# Patient Record
Sex: Female | Born: 1965 | Race: White | Hispanic: No | Marital: Married | Smoking: Never smoker
Health system: Southern US, Community
[De-identification: ages and names within clinical notes are randomized; demographics above are authoritative.]

## PROBLEM LIST (undated history)

## (undated) HISTORY — PX: OTHER SURGICAL HISTORY: SHX169

---

## 1998-01-21 ENCOUNTER — Other Ambulatory Visit: Admission: RE | Admit: 1998-01-21 | Discharge: 1998-01-21 | Payer: Self-pay | Admitting: Obstetrics and Gynecology

## 2015-09-16 ENCOUNTER — Ambulatory Visit (INDEPENDENT_AMBULATORY_CARE_PROVIDER_SITE_OTHER): Payer: BLUE CROSS/BLUE SHIELD | Admitting: Family Medicine

## 2015-09-16 VITALS — BP 126/78 | HR 86 | Temp 99.1°F | Resp 18 | Ht 64.5 in | Wt 180.0 lb

## 2015-09-16 DIAGNOSIS — R42 Dizziness and giddiness: Secondary | ICD-10-CM

## 2015-09-16 DIAGNOSIS — E669 Obesity, unspecified: Secondary | ICD-10-CM

## 2015-09-16 LAB — COMPLETE METABOLIC PANEL WITH GFR
ALT: 24 U/L (ref 6–29)
AST: 24 U/L (ref 10–35)
Albumin: 4.1 g/dL (ref 3.6–5.1)
Alkaline Phosphatase: 72 U/L (ref 33–115)
BUN: 14 mg/dL (ref 7–25)
CALCIUM: 9.4 mg/dL (ref 8.6–10.2)
CHLORIDE: 103 mmol/L (ref 98–110)
CO2: 26 mmol/L (ref 20–31)
Creat: 0.87 mg/dL (ref 0.50–1.10)
GFR, Est African American: >89 mL/min (ref >=60)
GFR, Est Non African American: 80 mL/min (ref >=60)
GLUCOSE: 105 mg/dL — AB (ref 65–99)
POTASSIUM: 3.8 mmol/L (ref 3.5–5.3)
SODIUM: 137 mmol/L (ref 135–146)
Total Bilirubin: 0.3 mg/dL (ref 0.2–1.2)
Total Protein: 7.2 g/dL (ref 6.1–8.1)

## 2015-09-16 LAB — CBC
HEMATOCRIT: 35.3 % (ref 35.0–45.0)
HEMOGLOBIN: 11.5 g/dL — AB (ref 11.7–15.5)
MCH: 27.6 pg (ref 27.0–33.0)
MCHC: 32.6 g/dL (ref 32.0–36.0)
MCV: 84.9 fL (ref 80.0–100.0)
MPV: 9.2 fL (ref 7.5–12.5)
PLATELETS: 321 10*3/uL (ref 140–400)
RBC: 4.16 MIL/uL (ref 3.80–5.10)
RDW: 15 % (ref 11.0–15.0)
WBC: 5 10*3/uL (ref 3.8–10.8)

## 2015-09-16 LAB — TSH: TSH: 0.96 m[IU]/L

## 2015-09-16 LAB — HEMOGLOBIN A1C
HEMOGLOBIN A1C: 5.4 % (ref <5.7)
Mean Plasma Glucose: 108 mg/dL

## 2015-09-16 NOTE — Progress Notes (Signed)
   Subjective:    Patient ID: Brittany Bell, female    DOB: 01-10-1966, 50 y.o.   MRN: [id number]  HPI This is a pleasant 50 yo female who presents today with dizziness off and on today. She did not have a sensation of spinning, but felt like she had to catch her balance. She felt off balance for a couple of hours. She feels better now, but her head feels a little "light." She has a history of seasonal allergies and takes Zyrtec. Had a cold with cough last month, but all symptoms resolved. Some pressure in ears. No SOB, no wheezing. Slept well last night. No nausea, no vomiting, no diarrhea or constipation. No chest pain, no palpitations. No falls. Does not feel as though she is going to lose consciousness. No headache.   She doesn't have regular health care. Has been healthy. 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 She has not had any problems with these abnormalities.   History reviewed. No pertinent past medical history. History reviewed. No pertinent past surgical history. History reviewed. No pertinent family history. Social History  Substance Use Topics  . Smoking status: Never Smoker   . Smokeless tobacco: None  . Alcohol Use: None    Review of Systems Per HPI    Objective:   Physical Exam  Constitutional: She is oriented to person, place, and time. She appears well-developed and well-nourished. No distress.  HENT:  Head: Normocephalic and atraumatic.  Right Ear: Tympanic membrane and ear canal normal.  Left Ear: Tympanic membrane, external ear and ear canal normal.  Nose: Mucosal edema and rhinorrhea present.  Mouth/Throat: Uvula is midline.  Right ear with misshapen lobe. High palate.   Eyes: Conjunctivae and EOM are normal. Pupils are equal, round, and reactive to light. Right eye exhibits no discharge. Left eye exhibits no discharge.  Neck: Normal range of motion. Neck supple.  Cardiovascular: Normal rate,  regular rhythm and normal heart sounds.   Pulmonary/Chest: Effort normal and breath sounds normal.  Abdominal: Soft. Bowel sounds are normal. She exhibits no distension. There is no tenderness. There is no rebound and no guarding.  Musculoskeletal: Normal range of motion.  Lymphadenopathy:    She has no cervical adenopathy.  Neurological: She is alert and oriented to person, place, and time. She has normal reflexes. No cranial nerve deficit. Coordination normal.  She is not currently symptomatic and I was unable to reproduce any symptoms with head movement or rapid change in position.   Skin: Skin is warm and dry. She is not diaphoretic.  Psychiatric: She has a normal mood and affect. Her behavior is normal. Judgment and thought content normal.  Vitals reviewed.  BP 126/78 mmHg  Pulse 86  Temp(Src) 99.1 F (37.3 C) (Oral)  Resp 18  Ht 5' 4.5" (1.638 m)  Wt 180 lb (81.647 kg)  BMI 30.43 kg/m2  SpO2 99%  LMP 09/16/2015  Orthostatic vs: Lying: 137/85, 72 Sitting: 126/83, 73 Standing: 125/80, 77    Assessment & Plan:  1. Lightheadedness - CBC - TSH - COMPLETE METABOLIC PANEL WITH GFR - Hemoglobin A1c - RTC if worsening symptoms or if no improvement in 2-3 days  2. Obesity (BMI 30-39.9) - TSH - COMPLETE METABOLIC PANEL WITH GFR - Hemoglobin A1c  - encouraged her to schedule mammogram and suggested CPE Clarene Reamer, FNP-BC  [facility]  09/17/2015 10:58 PM

## 2015-09-16 NOTE — Patient Instructions (Addendum)
Please drink enough liquids so your urine is light yellow Please come back in to see Brittany Bell or go to the emergency room if your symptoms worsen, you develop persistent vomiting or diarrhea.   We recommend that you schedule a mammogram for breast cancer screening. Typically, you do not need a referral to do this. Please contact a local imaging center to schedule your mammogram.  [facility] - [phone]  *ask for the Radiology Department [facility] - [phone] or [phone]  [facility] - [phone] [place] [phone] [facility] - [phone]  *ask for the [facility] - [phone]  *ask for the Radiology Department [facility] - [phone]  *ask for the [facility] - [phone]    Dizziness Dizziness is a common problem. It is a feeling of unsteadiness or light-headedness. You may feel like you are about to faint. Dizziness can lead to injury if you stumble or fall. Anyone can become dizzy, but dizziness is more common in older adults. This condition can be caused by a number of things, including medicines, dehydration, or illness. HOME CARE INSTRUCTIONS Taking these steps may help with your condition: Eating and Drinking  Drink enough fluid to keep your urine clear or pale yellow. This helps to keep you from becoming dehydrated. Try to drink more clear fluids, such as water.  Do not drink alcohol.  Limit your caffeine intake if directed by your health care provider.  Limit your salt intake if directed by your health care provider. Activity  Avoid making quick movements.  Rise slowly from chairs and steady yourself until you feel okay.  In the morning, first sit up on the side of the bed. When you feel okay, stand slowly while you hold onto something until you know that your balance is fine.  Move your  legs often if you need to stand in one place for a long time. Tighten and relax your muscles in your legs while you are standing.  Do not drive or operate heavy machinery if you feel dizzy.  Avoid bending down if you feel dizzy. Place items in your home so that they are easy for you to reach without leaning over. Lifestyle  Do not use any tobacco products, including cigarettes, chewing tobacco, or electronic cigarettes. If you need help quitting, ask your health care provider.  Try to reduce your stress level, such as with yoga or meditation. Talk with your health care provider if you need help. General Instructions  Watch your dizziness for any changes.  Take medicines only as directed by your health care provider. Talk with your health care provider if you think that your dizziness is caused by a medicine that you are taking.  Tell a friend or a family member that you are feeling dizzy. If he or she notices any changes in your behavior, have this person call your health care provider.  Keep all follow-up visits as directed by your health care provider. This is important. SEEK MEDICAL CARE IF:  Your dizziness does not go away.  Your dizziness or light-headedness gets worse.  You feel nauseous.  You have reduced hearing.  You have new symptoms.  You are unsteady on your feet or you feel like the room is spinning. SEEK IMMEDIATE MEDICAL CARE IF:  You vomit or have diarrhea and are unable to eat or drink anything.  You have problems  talking, walking, swallowing, or using your arms, hands, or legs.  You feel generally weak.  You are not thinking clearly or you have trouble forming sentences. It may take a friend or family member to notice this.  You have chest pain, abdominal pain, shortness of breath, or sweating.  Your vision changes.  You notice any bleeding.  You have a headache.  You have neck pain or a stiff neck.  You have a fever.   This information is not  intended to replace advice given to you by your health care provider. Make sure you discuss any questions you have with your health care provider.   Document Released: 11/01/2000 Document Revised: 09/22/2014 Document Reviewed: 05/04/2014 Elsevier Interactive Patient Education 2016 [employer].     IF you received an x-ray today, you will receive an invoice from [facility]. Please contact [facility] at [phone] with questions or concerns regarding your invoice.   IF you received labwork today, you will receive an invoice from [employer]. Please contact [facility] at [phone] with questions or concerns regarding your invoice.   Our billing staff will not be able to assist you with questions regarding bills from these companies.  You will be contacted with the lab results as soon as they are available. The fastest way to get your results is to activate your My Chart account. Instructions are located on the last page of this paperwork. If you have not heard from Brittany Bell regarding the results in 2 weeks, please contact this office.

## 2017-02-01 ENCOUNTER — Encounter: Payer: Self-pay | Admitting: Physician Assistant

## 2017-02-01 ENCOUNTER — Ambulatory Visit (INDEPENDENT_AMBULATORY_CARE_PROVIDER_SITE_OTHER): Payer: BLUE CROSS/BLUE SHIELD | Admitting: Physician Assistant

## 2017-02-01 VITALS — BP 125/80 | HR 83 | Temp 98.4°F | Resp 18 | Ht 64.5 in | Wt 181.0 lb

## 2017-02-01 DIAGNOSIS — N926 Irregular menstruation, unspecified: Secondary | ICD-10-CM

## 2017-02-01 DIAGNOSIS — N921 Excessive and frequent menstruation with irregular cycle: Secondary | ICD-10-CM | POA: Diagnosis not present

## 2017-02-01 DIAGNOSIS — Z124 Encounter for screening for malignant neoplasm of cervix: Secondary | ICD-10-CM

## 2017-02-01 NOTE — Progress Notes (Signed)
Brittany Bell  MRN: [id number] DOB: Oct 01, 1965  Subjective:  Brittany Bell is a 51 y.o. female seen in office today for a chief complaint of irregular menstural cycles x 4 months.Had one in May, end of June,end of August, and then started again 4 days ago. Prior to May, her cycles were regular, occurred every 21-25 days and lasted about 7 days. She typically goes through about 4 tampons a day. Has typical mild cramping during cycles. Notes these last 4 cycles have been heavier than typical. She has had some headaches during her the most recent menstrual cycles. Denies hot flashes, vaginal dryness, sleep disturbance, nipple discharge, and irritability. No changes in diet or exercise. Initially started cycle around 51 years old. G2P3. Pt is not currently sexually active. It has been over 5 months since she engaged in sexual intercourse. Does not know when her mother went through menopause.Denies smoking. Used loestrin >25 years ago for birth control but has not been on anything for 20 years.No PHM of breast cancer, uterine/ovarian cancer, colon cancer uterine fibroids or endometriosis. No FH of colon, breast, uterine, ovarian, cervical cancer. Last pap was 2011, normal.   Review of Systems  Constitutional: Negative for chills, diaphoresis and fever.  Eyes: Negative for visual disturbance.  Gastrointestinal: Negative for abdominal pain, constipation, diarrhea, nausea and vomiting.  Endocrine: Negative for cold intolerance, heat intolerance, polydipsia, polyphagia and polyuria.  Genitourinary: Negative for dyspareunia, dysuria, frequency, hematuria, urgency and vaginal discharge.  Musculoskeletal: Negative for arthralgias, joint swelling, myalgias and neck pain.  Neurological: Negative for dizziness, weakness and light-headedness.    There are no active problems to display for this patient.   Current Outpatient Prescriptions on File Prior to Visit  Medication Sig Dispense Refill  .  cetirizine (ZYRTEC) 10 MG tablet Take 10 mg by mouth daily.     No current facility-administered medications on file prior to visit.     No Known Allergies   Objective:  BP 125/80   Pulse 83   Temp 98.4 F (36.9 C) (Oral)   Resp 18   Ht 5' 4.5" (1.638 m)   Wt 181 lb (82.1 kg)   LMP 02/01/2017 Comment: currently on   SpO2 98%   BMI 30.59 kg/m   Physical Exam  Constitutional: She is oriented to person, place, and time and well-developed, well-nourished, and in no distress.  HENT:  Head: Normocephalic and atraumatic.  Eyes: Conjunctivae are normal.  Neck: Normal range of motion.  Pulmonary/Chest: Effort normal.  Abdominal: There is no tenderness.  Genitourinary: Uterus normal, cervix normal and left adnexa normal. Uterus is not enlarged, not fixed and not tender. Right adnexum displays no mass and no tenderness. Left adnexum displays no mass and no tenderness.  Genitourinary Comments: Bloody discharge noted exiting cervical os into vaginal canal.    Neurological: She is alert and oriented to person, place, and time. Gait normal.  Skin: Skin is warm and dry.  Psychiatric: Affect normal.  Vitals reviewed.   Assessment and Plan :  1. Irregular periods/menstrual cycles Discussed with patient that due to her age, this is likely normal perimenopausal response. Will check CBC and TSH today. Due to new onset menorrhagia, will order transvaginal and pelvic US to rule out fibroids. Will contact pt with results. Will discuss further tx plan/gyn referral depending on lab/imaging results. Return if symptoms worsen.  - CBC with Differential/Platelet - TSH 2. Menorrhagia with irregular cycle - US Transvaginal Non-OB; Future - US Pelvis Complete; Future  3. Screening for cervical cancer - Pap IG and HPV (high risk) DNA detection  Tenna Delaine PA-C  [facility] [place] 02/01/2017 1:56 PM

## 2017-02-01 NOTE — Patient Instructions (Addendum)
I recommend you have a pelvic ultrasound to ensure that you have not developed any fibroids or other uterine problems. If this and your labs are normal, this is likely all due to perimenopause. We can discuss further tx plan once we have these results. If anything worsens in the meantime, please seek care. Thank you for letting me participate in your health and well being.    Perimenopause Perimenopause is the time when your body begins to move into the menopause (no menstrual period for 12 straight months). It is a natural process. Perimenopause can begin 2-8 years before the menopause and usually lasts for 1 year after the menopause. During this time, your ovaries may or may not produce an egg. The ovaries vary in their production of estrogen and progesterone hormones each month. This can cause irregular menstrual periods, difficulty getting pregnant, vaginal bleeding between periods, and uncomfortable symptoms. What are the causes?  Irregular production of the ovarian hormones, estrogen and progesterone, and not ovulating every month. Other causes include:  Tumor of the pituitary gland in the brain.  Medical disease that affects the ovaries.  Radiation treatment.  Chemotherapy.  Unknown causes.  Heavy smoking and excessive alcohol intake can bring on perimenopause sooner.  What are the signs or symptoms?  Hot flashes.  Night sweats.  Irregular menstrual periods.  Decreased sex drive.  Vaginal dryness.  Headaches.  Mood swings.  Depression.  Memory problems.  Irritability.  Tiredness.  Weight gain.  Trouble getting pregnant.  The beginning of losing bone cells (osteoporosis).  The beginning of hardening of the arteries (atherosclerosis). How is this diagnosed? Your health care provider will make a diagnosis by analyzing your age, menstrual history, and symptoms. He or she will do a physical exam and note any changes in your body, especially your female organs.  Female hormone tests may or may not be helpful depending on the amount of female hormones you produce and when you produce them. However, other hormone tests may be helpful to rule out other problems. How is this treated? In some cases, no treatment is needed. The decision on whether treatment is necessary during the perimenopause should be made by you and your health care provider based on how the symptoms are affecting you and your lifestyle. Various treatments are available, such as:  Treating individual symptoms with a specific medicine for that symptom.  Herbal medicines that can help specific symptoms.  Counseling.  Group therapy.  Follow these instructions at home:  Keep track of your menstrual periods (when they occur, how heavy they are, how long between periods, and how long they last) as well as your symptoms and when they started.  Only take over-the-counter or prescription medicines as directed by your health care provider.  Sleep and rest.  Exercise.  Eat a diet that contains calcium (good for your bones) and soy (acts like the estrogen hormone).  Do not smoke.  Avoid alcoholic beverages.  Take vitamin supplements as recommended by your health care provider. Taking vitamin E may help in certain cases.  Take calcium and vitamin D supplements to help prevent bone loss.  Group therapy is sometimes helpful.  Acupuncture may help in some cases. Contact a health care provider if:  You have questions about any symptoms you are having.  You need a referral to a specialist (gynecologist, psychiatrist, or psychologist). Get help right away if:  You have vaginal bleeding.  Your period lasts longer than 8 days.  Your periods are recurring sooner  than 21 days.  You have bleeding after intercourse.  You have severe depression.  You have pain when you urinate.  You have severe headaches.  You have vision problems. This information is not intended to replace  advice given to you by your health care provider. Make sure you discuss any questions you have with your health care provider. Document Released: 06/15/2004 Document Revised: 10/14/2015 Document Reviewed: 12/05/2012 Elsevier Interactive Patient Education  2017 [employer].     IF you received an x-ray today, you will receive an invoice from [facility]. Please contact [facility] at [phone] with questions or concerns regarding your invoice.   IF you received labwork today, you will receive an invoice from [facility]. Please contact [facility] at [phone] with questions or concerns regarding your invoice.   Our billing staff will not be able to assist you with questions regarding bills from these companies.  You will be contacted with the lab results as soon as they are available. The fastest way to get your results is to activate your My Chart account. Instructions are located on the last page of this paperwork. If you have not heard from [place] regarding the results in 2 weeks, please contact this office.

## 2017-02-02 LAB — CBC WITH DIFFERENTIAL/PLATELET
BASOS ABS: 0 10*3/uL (ref 0.0–0.2)
BASOS: 0 %
EOS (ABSOLUTE): 0.1 10*3/uL (ref 0.0–0.4)
Eos: 2 %
Hematocrit: 31.8 % — ABNORMAL LOW (ref 34.0–46.6)
Hemoglobin: 9.8 g/dL — ABNORMAL LOW (ref 11.1–15.9)
IMMATURE GRANS (ABS): 0 10*3/uL (ref 0.0–0.1)
Immature Granulocytes: 0 %
LYMPHS ABS: 1.5 10*3/uL (ref 0.7–3.1)
Lymphs: 24 %
MCH: 27.8 pg (ref 26.6–33.0)
MCHC: 30.8 g/dL — AB (ref 31.5–35.7)
MCV: 90 fL (ref 79–97)
MONOS ABS: 0.5 10*3/uL (ref 0.1–0.9)
Monocytes: 7 %
NEUTROS ABS: 4.2 10*3/uL (ref 1.4–7.0)
Neutrophils: 67 %
PLATELETS: 296 10*3/uL (ref 150–379)
RBC: 3.52 x10E6/uL — ABNORMAL LOW (ref 3.77–5.28)
RDW: 16.1 % — AB (ref 12.3–15.4)
WBC: 6.3 10*3/uL (ref 3.4–10.8)

## 2017-02-02 LAB — TSH: TSH: 0.85 u[IU]/mL (ref 0.450–4.500)

## 2017-02-05 LAB — PAP IG AND HPV HIGH-RISK
HPV, high-risk: NEGATIVE
PAP Smear Comment: 0

## 2017-02-06 ENCOUNTER — Other Ambulatory Visit: Payer: Self-pay | Admitting: Physician Assistant

## 2017-02-06 ENCOUNTER — Encounter: Payer: Self-pay | Admitting: Physician Assistant

## 2017-02-06 DIAGNOSIS — N926 Irregular menstruation, unspecified: Secondary | ICD-10-CM

## 2017-02-06 DIAGNOSIS — D649 Anemia, unspecified: Secondary | ICD-10-CM

## 2017-02-06 DIAGNOSIS — N921 Excessive and frequent menstruation with irregular cycle: Secondary | ICD-10-CM

## 2017-02-06 MED ORDER — FERROUS SULFATE 325 (65 FE) MG PO TABS
325.0000 mg | ORAL_TABLET | Freq: Every day | ORAL | 0 refills | Status: DC
Start: 2017-02-06 — End: 2018-03-08

## 2017-02-06 MED ORDER — LEVONORGESTREL-ETHINYL ESTRAD 0.1-20 MG-MCG PO TABS: 1.0000 | ORAL_TABLET | Freq: Every day | ORAL | 0 refills | Status: DC

## 2017-02-06 NOTE — Progress Notes (Signed)
Meds ordered this encounter  Medications  . levonorgestrel-ethinyl estradiol (AVIANE) 0.1-20 MG-MCG tablet    Sig: Take 1 tablet by mouth daily.    Dispense:  1 Package    Refill:  0    Order Specific Question:   Supervising Provider    Answer:   Wardell Honour [[id number]]  . ferrous sulfate 325 (65 FE) MG tablet    Sig: Take 1 tablet (325 mg total) by mouth daily with breakfast.    Dispense:  30 tablet    Refill:  0    Order Specific Question:   Supervising Provider    Answer:   Wardell Honour [[id number]]    Orders Placed This Encounter  Procedures  . CBC with Differential/Platelet    Standing Status:   Future    Standing Expiration Date:   02/13/2017  . Ambulatory referral to Gynecology    Referral Priority:   Routine    Referral Type:   Consultation    Referral Reason:   Specialty Services Required    Requested Specialty:   Gynecology    Number of Visits Requested:   1

## 2017-02-19 ENCOUNTER — Ambulatory Visit
Admission: RE | Admit: 2017-02-19 | Discharge: 2017-02-19 | Disposition: A | Discharge status: Home / Self Care | Payer: BLUE CROSS/BLUE SHIELD | Source: Ambulatory Visit | Attending: Physician Assistant | Admitting: Physician Assistant

## 2017-02-19 DIAGNOSIS — N921 Excessive and frequent menstruation with irregular cycle: Secondary | ICD-10-CM

## 2017-02-20 ENCOUNTER — Telehealth: Payer: Self-pay | Admitting: Physician Assistant

## 2017-02-20 NOTE — Telephone Encounter (Signed)
Left message to contact our office back to discuss [place] results. Also sent a mychart message regarding her US findings.

## 2017-02-28 ENCOUNTER — Encounter: Payer: Self-pay | Admitting: Gynecology

## 2017-02-28 ENCOUNTER — Ambulatory Visit (INDEPENDENT_AMBULATORY_CARE_PROVIDER_SITE_OTHER): Payer: BLUE CROSS/BLUE SHIELD | Admitting: Gynecology

## 2017-02-28 VITALS — BP 120/78 | Ht 65.0 in | Wt 180.0 lb

## 2017-02-28 DIAGNOSIS — N926 Irregular menstruation, unspecified: Secondary | ICD-10-CM | POA: Diagnosis not present

## 2017-02-28 DIAGNOSIS — D508 Other iron deficiency anemias: Secondary | ICD-10-CM | POA: Diagnosis not present

## 2017-02-28 DIAGNOSIS — D259 Leiomyoma of uterus, unspecified: Secondary | ICD-10-CM

## 2017-02-28 NOTE — Patient Instructions (Signed)
Follow up for ultrasound as scheduled   Call to Schedule your mammogram  [facility]. [address] Phone: [phone]     Mammogram A mammogram is an X-ray test to find changes in a woman's breast. You should get a mammogram if:  You are 49 years of age or older  You have risk factors.   Your doctor recommends that you have one.  BEFORE THE TEST  Do not schedule the test the week before your period, especially if your breasts are sore during this time.  On the day of your mammogram:  Wash your breasts and armpits well. After washing, do not put on any deodorant or talcum powder on until after your test.   Eat and drink as you usually do.   Take your medicines as usual.   If you are diabetic and take insulin, make sure you:   Eat before coming for your test.   Take your insulin as usual.   If you cannot keep your appointment, call before the appointment to cancel. Schedule another appointment.  TEST  You will need to undress from the waist up. You will put on a hospital gown.   Your breast will be put on the mammogram machine, and it will press firmly on your breast with a piece of plastic called a compression paddle. This will make your breast flatter so that the machine can X-ray all parts of your breast.   Both breasts will be X-rayed. Each breast will be X-rayed from above and from the side. An X-ray might need to be taken again if the picture is not good enough.   The mammogram will last about 15 to 30 minutes.  AFTER THE TEST Finding out the results of your test Ask when your test results will be ready. Make sure you get your test results.  Document Released: 08/04/2008 Document Revised: 04/27/2011 Document Reviewed: 08/04/2008 [facility] Patient Information 2012 [place].

## 2017-02-28 NOTE — Progress Notes (Addendum)
    Brittany Bell 07-03-1965 [id number]        51 y.o.  G2P0 new patient presents in consultation from [place], PA-C primary care at [facility] in reference to heavy irregular bleeding. Patient notes that she was having regular, fairly heavy menses monthly until she skipped in June. She then had a period at the end of June, skipped July and then had 2 heavy periods in August.  Evaluation showed a normal pelvic with some bloody discharge. 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 Overall uterine size 13 x 7 x 8 cm. Endometrial echo 16 mm. Right and left ovaries normal. Was started on low-dose oral contraceptives 1 pack which she just finished and she has stopped bleeding in the interim. Is been a number of years since a GYN exam. Her recent Pap smear was normal with negative HPV. Not sexually active.  Past medical history,surgical history, problem list, medications, allergies, family history and social history were all reviewed and documented in the EPIC chart.  Directed ROS with pertinent positives and negatives documented in the history of present illness/assessment and plan.  Exam: Caryn Bee assistant Vitals:   02/28/17 1447  BP: 120/78  Weight: 180 lb (81.6 kg)  Height: 5\' 5"  (1.651 m)   General appearance:  Normal Breasts examined lying and sitting without masses, retractions, discharge, adenopathy. Abdomen soft nontender without masses guarding rebound Pelvic external BUS vagina normal. Cervix normal. Uterus grossly normal midline mobile nontender. Adnexa without masses or tenderness.  Assessment/Plan:  51 y.o. G2P0003 with history of regular menses through June with a skip in January and a skip in July and heavy irregular bleeding in August. Subsequent restarted on birth control pills which stopped her bleeding until now. 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 Never told she had myomas in the past. Anemic with  hemoglobin of 9.8 started on iron replacement. Currently not sexually active. We reviewed the pathophysiology of leiomyoma and the possibility of this causing her irregular bleeding. We also discussed about ovulatory irregularity particularly in the later 40s as well as structural defects such as endometrial polyps and submucous myomas. Will check baseline FSH as well as recheck her CBC. Will schedule sonohysterogram given the thicker appearing endometrial echo on regular ultrasound. Patient will schedule in about a month and we will see what her bleeding does in the interim. Various possibilities depending on pathology found were reviewed with the patient to include observation, hormonal manipulation, hysteroscopy D&C with resection of endometrial defects up to including hysterectomy. Patient will follow up in a month or so for her sonohysterogram and then we'll go from there. She'll follow up sooner if significant irregular bleeding.   Greater than 50% of my time was spent in direct face to face counseling and coordination of care with the patient.  A return consult communication was sent to the referring provider.     Anastasio Auerbach MD, 3:25 PM 02/28/2017

## 2017-03-01 LAB — CBC WITH DIFFERENTIAL/PLATELET
Basophils Absolute: 38 cells/uL (ref 0–200)
Basophils Relative: 0.6 %
EOS PCT: 1.3 %
Eosinophils Absolute: 83 cells/uL (ref 15–500)
HEMATOCRIT: 34 % — AB (ref 35.0–45.0)
Hemoglobin: 10.9 g/dL — ABNORMAL LOW (ref 11.7–15.5)
LYMPHS ABS: 1882 {cells}/uL (ref 850–3900)
MCH: 27.5 pg (ref 27.0–33.0)
MCHC: 32.1 g/dL (ref 32.0–36.0)
MCV: 85.6 fL (ref 80.0–100.0)
MPV: 10 fL (ref 7.5–12.5)
Monocytes Relative: 8.8 %
NEUTROS PCT: 59.9 %
Neutro Abs: 3834 cells/uL (ref 1500–7800)
PLATELETS: 351 10*3/uL (ref 140–400)
RBC: 3.97 10*6/uL (ref 3.80–5.10)
RDW: 15.7 % — AB (ref 11.0–15.0)
TOTAL LYMPHOCYTE: 29.4 %
WBC mixed population: 563 cells/uL (ref 200–950)
WBC: 6.4 10*3/uL (ref 3.8–10.8)

## 2017-03-01 LAB — FOLLICLE STIMULATING HORMONE: FSH: 1.6 m[IU]/mL

## 2017-03-21 ENCOUNTER — Other Ambulatory Visit: Payer: Self-pay | Admitting: Gynecology

## 2017-03-21 DIAGNOSIS — N939 Abnormal uterine and vaginal bleeding, unspecified: Secondary | ICD-10-CM

## 2017-04-04 ENCOUNTER — Ambulatory Visit (INDEPENDENT_AMBULATORY_CARE_PROVIDER_SITE_OTHER): Payer: BLUE CROSS/BLUE SHIELD | Admitting: Gynecology

## 2017-04-04 ENCOUNTER — Encounter: Payer: Self-pay | Admitting: Gynecology

## 2017-04-04 ENCOUNTER — Ambulatory Visit (INDEPENDENT_AMBULATORY_CARE_PROVIDER_SITE_OTHER): Payer: BLUE CROSS/BLUE SHIELD

## 2017-04-04 VITALS — BP 122/78

## 2017-04-04 DIAGNOSIS — N926 Irregular menstruation, unspecified: Secondary | ICD-10-CM | POA: Diagnosis not present

## 2017-04-04 DIAGNOSIS — N939 Abnormal uterine and vaginal bleeding, unspecified: Secondary | ICD-10-CM

## 2017-04-04 DIAGNOSIS — D259 Leiomyoma of uterus, unspecified: Secondary | ICD-10-CM | POA: Diagnosis not present

## 2017-04-04 NOTE — Patient Instructions (Signed)
Office will call with biopsy results 

## 2017-04-04 NOTE — Progress Notes (Signed)
    Brittany Bell [birth year] [id number]        51 y.o.  G2P0 presents for sonohysterogram.  History of regular menses until skipped in June.  Had 2 menses in August.  Was started on low-dose oral contraceptives times 1 pack with resolution of her bleeding.  Ultrasound showed multiple myomas largest measuring 4.7 cm with endometrial echo at 16 mm.  Right and left ovaries normal.  Past medical history,surgical history, problem list, medications, allergies, family history and social history were all reviewed and documented in the EPIC chart.  Directed ROS with pertinent positives and negatives documented in the history of present illness/assessment and plan.  Exam: [place] assistant General appearance:  Normal Abdomen soft nontender without masses guarding rebound Pelvic external BUS vagina normal.  Cervix normal.  Uterus grossly normal size midline mobile nontender.  Adnexa without masses or tenderness.  Ultrasound transvaginal shows uterus retroverted with heterogeneous cortical cystic areas noted.  Questionable adenomyosis.  No clear leiomyoma noted.  Endometrial echo 10.3 mm try layered.  Right and left ovaries normal.  Cul-de-sac negative.  Sonohistogram performed, sterile technique, single-tooth tenaculum anterior lip stabilization with catheter advanced to the lower uterine segment but unable to negotiate the retroversion of the uterus.  There was good distention with no abnormality seen.  Biopsy was taken.  Patient tolerated well.  Assessment/Plan:  51 y.o. G2P0 with heavier menses this past year an episode of irregular bleeding although now corrected.  Ultrasound suggests adenomyosis.  No endometrial cavity defects noted.  I was unable to thread the catheter into the upper endometrial cavity due to the retroversion of the uterus.  I reviewed with the patient that the sample will be somewhat limited and not predictive of the upper cavity.  The issues of missed diagnoses discussed.   Given though her history with ultrasound showing a try layered endometrium and empty cavity with distention unlikely significant pathology but no guarantees.  Patient was at the limits of her pain tolerance as far as trying to enter the cavity with the catheter.  Options for more aggressive approach to evaluate the endometrium with sedation and D&C.  At this point the patient is comfortable with no further evaluation.  As long as her menses regulate then will follow.  Options for her heavier menses reviewed to include observation, hormonal manipulation such as low-dose oral contraceptives, attempt at [place] IUD although recognizing all to thread the sonohysterogram catheter, endometrial ablation and hysterectomy.  At this point the patient is leaning towards low-dose oral contraceptives.  She does not smoke and is not being followed for any medical issues.  Risks with advancing age to include increased risk of thrombosis such as stroke heart attack DVT reviewed.  Patient will think of all of her options and will follow up with her decision when we call her with the biopsy results.  I again stressed that these will probably be a limited value from an endometrial predictive standpoint she understands and accepts this.    Anastasio Auerbach MD, 12:17 PM 04/04/2017

## 2017-04-10 ENCOUNTER — Telehealth: Payer: Self-pay | Admitting: *Deleted

## 2017-04-10 MED ORDER — LEVONORGESTREL-ETHINYL ESTRAD 0.1-20 MG-MCG PO TABS: 1.0000 | ORAL_TABLET | Freq: Every day | ORAL | 3 refills | Status: DC

## 2017-04-10 NOTE — Telephone Encounter (Signed)
Patient called to follow up from [place] 04/04/17 said she would like to start on birth control pills.

## 2017-04-10 NOTE — Telephone Encounter (Signed)
Rx sent 

## 2017-04-10 NOTE — Telephone Encounter (Signed)
Okay for refill for aviane 0.1-20 equivalent times 1 year

## 2018-03-07 ENCOUNTER — Other Ambulatory Visit: Payer: Self-pay | Admitting: Gynecology

## 2018-03-08 ENCOUNTER — Encounter: Payer: Self-pay | Admitting: Gynecology

## 2018-03-08 ENCOUNTER — Ambulatory Visit: Payer: BLUE CROSS/BLUE SHIELD | Admitting: Gynecology

## 2018-03-08 VITALS — BP 130/80 | Ht 65.0 in | Wt 163.0 lb

## 2018-03-08 DIAGNOSIS — Z01419 Encounter for gynecological examination (general) (routine) without abnormal findings: Secondary | ICD-10-CM | POA: Diagnosis not present

## 2018-03-08 MED ORDER — LEVONORGESTREL-ETHINYL ESTRAD 0.1-20 MG-MCG PO TABS: 1.0000 | ORAL_TABLET | Freq: Every day | ORAL | 4 refills | Status: AC

## 2018-03-08 NOTE — Progress Notes (Signed)
    VENECIA MEHL 04-06-1966 [id number]        52 y.o.  G2P0 for annual gynecologic exam.  Doing well without complaints.  History of irregular bleeding this past year with negative sonohysterogram and negative endometrial biopsy.  Was started on 20 mcg low-dose oral contraceptive and has had regular menses since.  Past medical history,surgical history, problem list, medications, allergies, family history and social history were all reviewed and documented as reviewed in the EPIC chart.  ROS:  Performed with pertinent positives and negatives included in the history, assessment and plan.   Additional significant findings : None   Exam: Caryn Bee assistant Vitals:   03/08/18 1505  BP: 130/80  Weight: 163 lb (73.9 kg)  Height: 5\' 5"  (1.651 m)   Body mass index is 27.12 kg/m.  General appearance:  Normal affect, orientation and appearance. Skin: Grossly normal HEENT: Without gross lesions.  No cervical or supraclavicular adenopathy. Thyroid normal.  Lungs:  Clear without wheezing, rales or rhonchi Cardiac: RR, without RMG Abdominal:  Soft, nontender, without masses, guarding, rebound, organomegaly or hernia Breasts:  Examined lying and sitting without masses, retractions, discharge or axillary adenopathy. Pelvic:  Ext, BUS, Vagina: Normal  Cervix: Normal  Uterus: Retroverted, normal size, shape and contour, midline and mobile nontender   Adnexa: Without masses or tenderness    Anus and perineum: Normal   Rectovaginal: Normal sphincter tone without palpated masses or tenderness.    Assessment/Plan:  52 y.o. G2P0 female for annual gynecologic exam with regular menses, oral contraceptives.   1. Oral contraceptives doing well on 1/20 equivalent pill.  We again reviewed the risks versus benefits.  Not being followed for any medical issues or no history of smoking.  Risks of thrombosis discussed.  Patient comfortable continuing.  Refill x1 year provided. 2. Mammogram never.  Most  common cancer in women discussed.  Benefits of early detection reviewed.  Strongly encouraged patient to call and schedule a baseline mammogram.  Names and numbers provided.  Breast exam normal today. 3. Pap smear/HPV 01/2017.  No Pap smear done today.  No history of significant abnormal Pap smears.  Plan repeat Pap smear at 5-year interval per current screening guidelines. 4. Health maintenance.  No routine lab work done as patient does this elsewhere.  Follow-up 1 year, sooner as needed.   Anastasio Auerbach MD, 3:27 PM 03/08/2018

## 2018-03-08 NOTE — Patient Instructions (Signed)
Call to Schedule your mammogram  Facilities in [place]: 1)  [facility]. [facility], [address] Phone: [phone] 2)  Dr. Bertrand at [facility]  [address] Phone: [phone]     Mammogram A mammogram is an X-ray test to find changes in a woman's breast. You should get a mammogram if:  You are 50 years of age or older  You have risk factors.   Your doctor recommends that you have one.  BEFORE THE TEST  Do not schedule the test the week before your period, especially if your breasts are sore during this time.  On the day of your mammogram:  Wash your breasts and armpits well. After washing, do not put on any deodorant or talcum powder on until after your test.   Eat and drink as you usually do.   Take your medicines as usual.   If you are diabetic and take insulin, make sure you:   Eat before coming for your test.   Take your insulin as usual.   If you cannot keep your appointment, call before the appointment to cancel. Schedule another appointment.  TEST  You will need to undress from the waist up. You will put on a hospital gown.   Your breast will be put on the mammogram machine, and it will press firmly on your breast with a piece of plastic called a compression paddle. This will make your breast flatter so that the machine can X-ray all parts of your breast.   Both breasts will be X-rayed. Each breast will be X-rayed from above and from the side. An X-ray might need to be taken again if the picture is not good enough.   The mammogram will last about 15 to 30 minutes.  AFTER THE TEST Finding out the results of your test Ask when your test results will be ready. Make sure you get your test results.  Document Released: 08/04/2008 Document Revised: 04/27/2011 Document Reviewed: 08/04/2008 [employer] Patient Information 2012 [employer].   

## 2019-02-12 ENCOUNTER — Encounter: Payer: Self-pay | Admitting: Gynecology

## 2019-02-27 IMAGING — US US TRANSVAGINAL NON-OB
1 series · 13 of 25 positions shown · non-contrast
Comparison: None

CLINICAL DATA: Initial evaluation for menorrhagia with irregular
periods for 4 months.

EXAM:
TRANSABDOMINAL AND TRANSVAGINAL ULTRASOUND OF PELVIS
TECHNIQUE: Both transabdominal and transvaginal ultrasound examinations of the
pelvis were performed. Transabdominal technique was performed for
global imaging of the pelvis including uterus, ovaries, adnexal
regions, and pelvic cul-de-sac. It was necessary to proceed with
endovaginal exam following the transabdominal exam to visualize the
uterus and ovaries.

[Series 1: us transvaginal non-ob · 0.28mm/px · 13 of 93 slices shown]
[im 1/93]
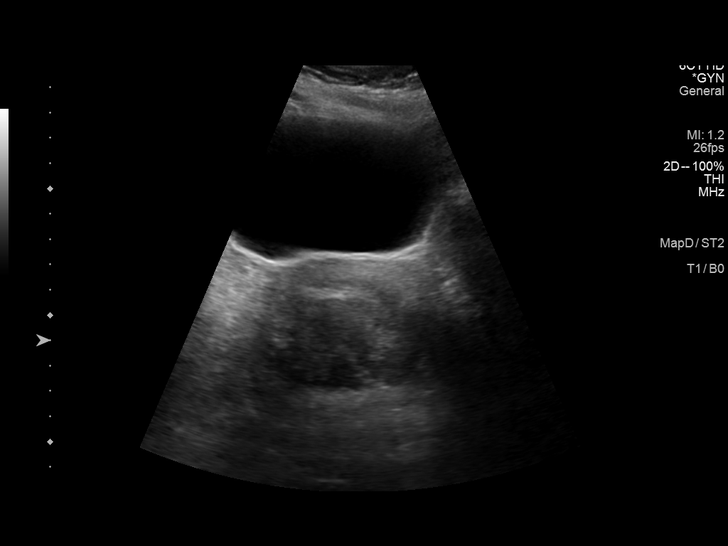
[im 8/93]
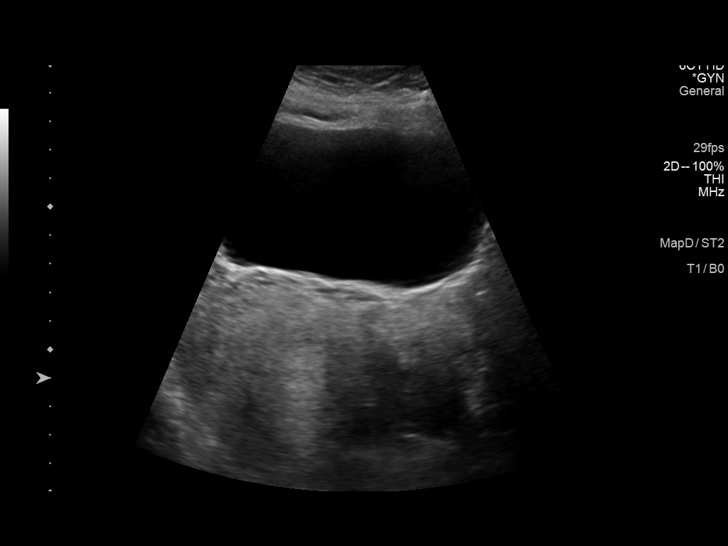
[im 16/93]
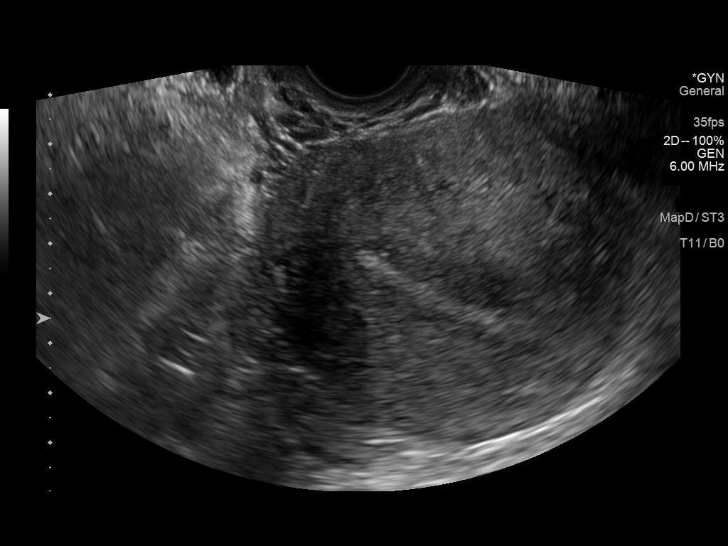
[im 24/93]
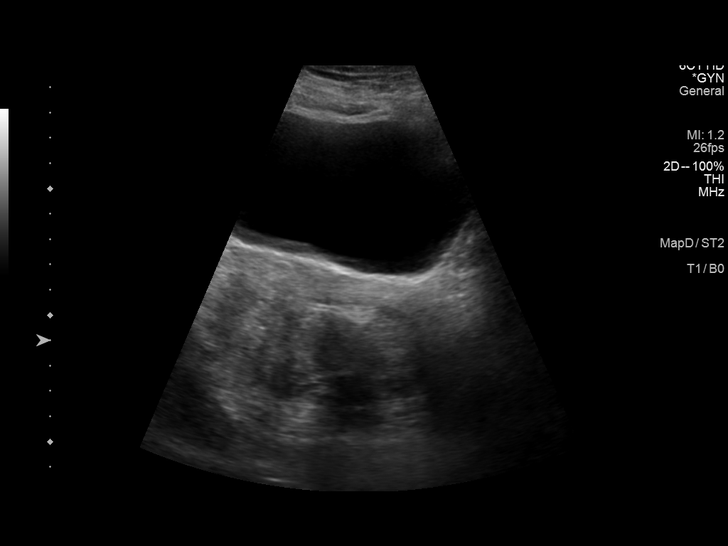
[im 31/93]
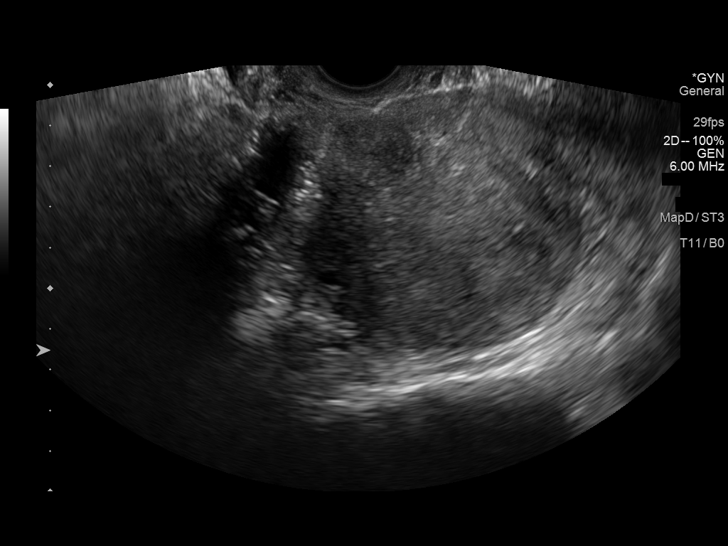
[im 39/93]
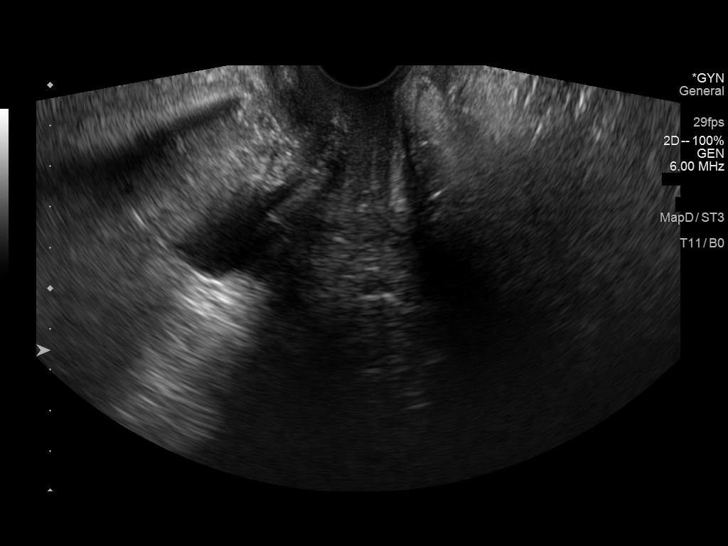
[im 47/93]
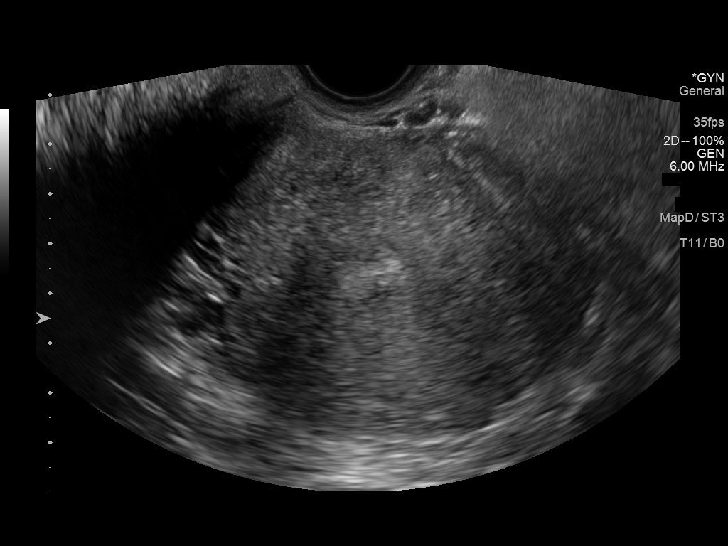
[im 54/93]
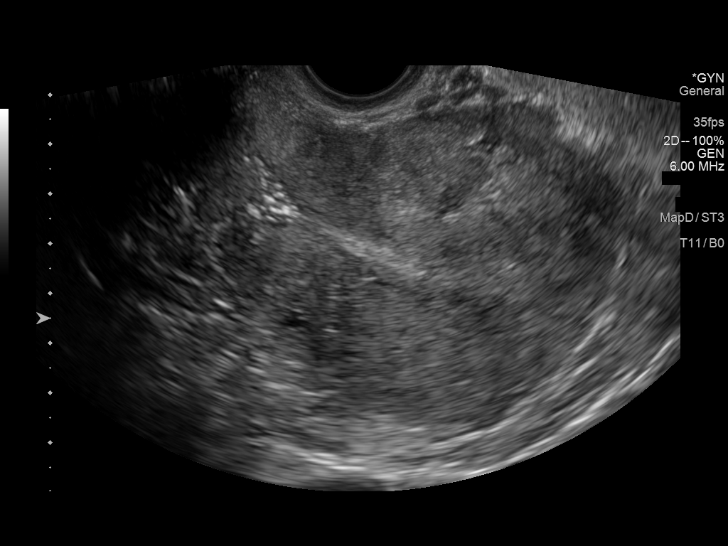
[im 62/93]
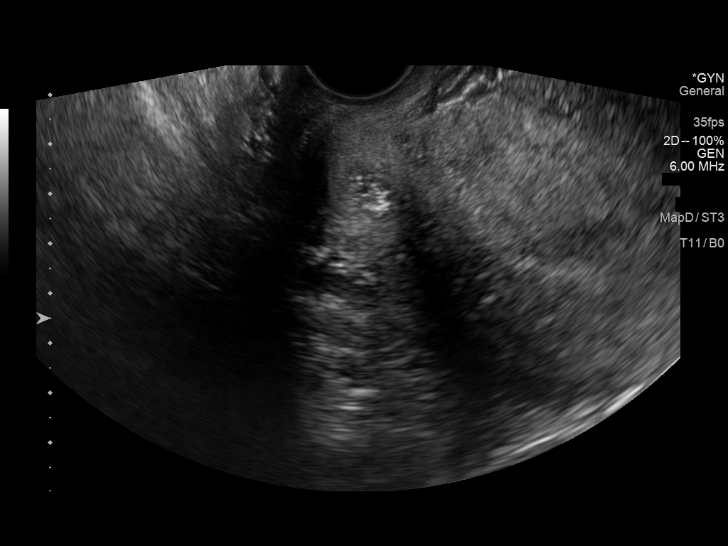
[im 70/93]
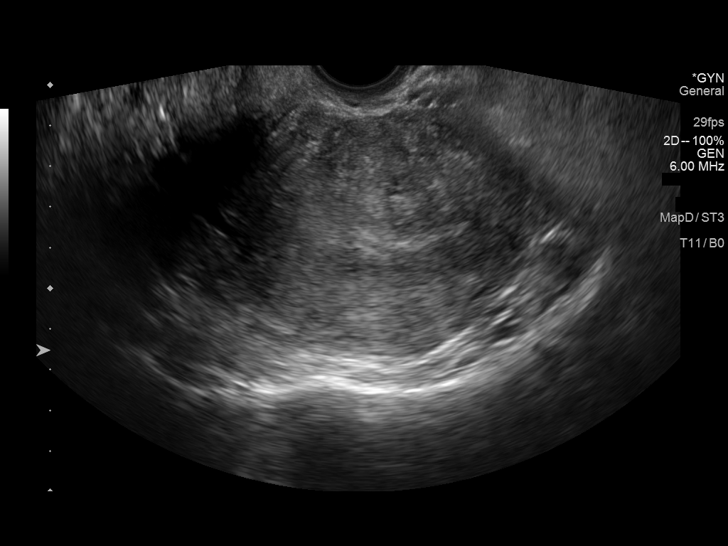
[im 77/93]
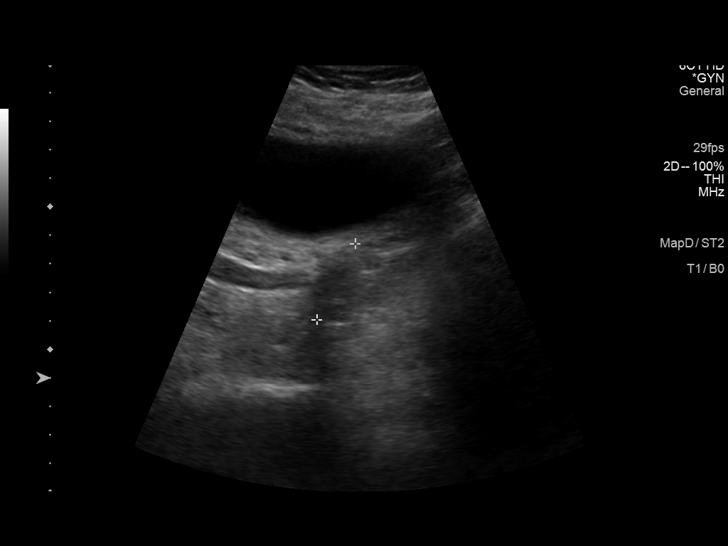
[im 85/93]
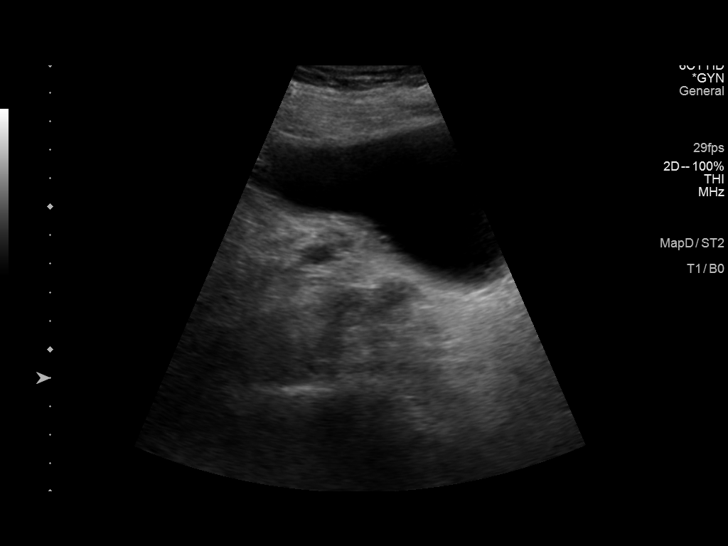
[im 93/93]
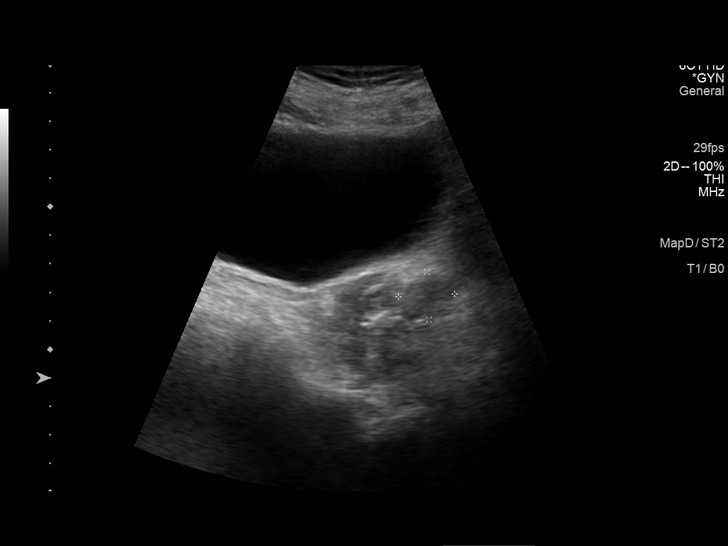

[13 of 25 positions shown; findings below may reference images not displayed]

FINDINGS: Uterus

Measurements: 13.2 x 7.4 x 8.4 cm. Uterus is retroverted. Multiple
fibroids are present. Intramural fibroid positioned at the anterior
lower uterine segment measured 2.3 x 1.7 x 1.9 cm. Larger intramural
fibroid positioned at the uterine fundus measured 4.7 x 3.2 x
cm. Additional probable fibroid with associated microcalcifications
positioned at the lower uterine segment measured 2.3 x 1.3 x 1.3 cm

Endometrium

Thickness: 16 mm. No definite focal abnormality identified. Micro
calcific densities overlie the lower uterine segment.

Right ovary

Measurements: 3.0 x 2.0 x 2.1 cm. Normal appearance/no adnexal mass.

Left ovary

Measurements: 3.0 x 1.7 x 2.0 cm. Normal appearance/no adnexal mass.

Other findings

No abnormal free fluid.
IMPRESSION: 1. Endometrial stripe measuring up to 16 mm, considered abnormal in
this patient with history of menorrhagia and bleeding. If bleeding
remains unresponsive to hormonal or medical therapy, focal lesion
work-up with sonohysterogram should be considered. Endometrial
biopsy should also be considered in pre-menopausal patients at high
risk for endometrial carcinoma. (Ref: Radiological Reasoning:
Algorithmic Workup of Abnormal Vaginal Bleeding with Endovaginal
Sonography and Sonohysterography. AJR 4113; 191:S68-73).
2. Fibroid uterus as above.
3. Normal sonographic appearance of the ovaries.  No adnexal mass.

## 2019-07-19 ENCOUNTER — Ambulatory Visit: Payer: Self-pay | Attending: Internal Medicine

## 2019-07-19 DIAGNOSIS — Z23 Encounter for immunization: Secondary | ICD-10-CM | POA: Insufficient documentation

## 2019-07-19 NOTE — Progress Notes (Signed)
   Covid-19 Vaccination Clinic  Name:  Brittany Bell    MRN: [id number] DOB: [birth year]  07/19/2019  [name] was observed post Covid-19 immunization for 15 minutes without incidence. She was provided with Vaccine Information Sheet and instruction to access the V-Safe system.   [name] was instructed to call 911 with any severe reactions post vaccine: Marland Kitchen Difficulty breathing  . Swelling of your face and throat  . A fast heartbeat  . A bad rash all over your body  . Dizziness and weakness    Immunizations Administered    Name Date Dose VIS Date Route   Pfizer COVID-19 Vaccine 07/19/2019 10:51 AM 0.3 mL 05/02/2019 Intramuscular   Manufacturer: [place]   Lot: [id number]   [place]: [id number]

## 2019-08-09 ENCOUNTER — Ambulatory Visit: Payer: Self-pay | Attending: Internal Medicine

## 2019-08-09 ENCOUNTER — Ambulatory Visit: Payer: Self-pay

## 2019-08-09 DIAGNOSIS — Z23 Encounter for immunization: Secondary | ICD-10-CM

## 2019-08-09 NOTE — Progress Notes (Signed)
   Covid-19 Vaccination Clinic  Name:  AHRIANNA GLASSCO    MRN: [id number] DOB: 1965-07-22  08/09/2019  Ms. Goudeau was observed post Covid-19 immunization for 15 minutes without incident. She was provided with Vaccine Information Sheet and instruction to access the V-Safe system.   Ms. Bice was instructed to call 911 with any severe reactions post vaccine: Marland Kitchen Difficulty breathing  . Swelling of face and throat  . A fast heartbeat  . A bad rash all over body  . Dizziness and weakness   Immunizations Administered    Name Date Dose VIS Date Route   Pfizer COVID-19 Vaccine 08/09/2019 11:54 AM 0.3 mL 05/02/2019 Intramuscular   Manufacturer: [place]   Lot: [id number]   [place]: [id number]

## 2019-08-13 ENCOUNTER — Ambulatory Visit: Payer: Self-pay

## 2020-05-26 ENCOUNTER — Other Ambulatory Visit: Payer: Self-pay

## 2020-05-29 ENCOUNTER — Ambulatory Visit: Payer: Self-pay
